# Patient Record
Sex: Male | Born: 1970 | Race: Black or African American | Hispanic: No | Marital: Single | Smoking: Current every day smoker
Health system: Southern US, Community
[De-identification: ages and names within clinical notes are randomized; demographics above are authoritative.]

## PROBLEM LIST (undated history)

## (undated) DIAGNOSIS — I10 Essential (primary) hypertension: Secondary | ICD-10-CM

---

## 2000-10-12 ENCOUNTER — Emergency Department (HOSPITAL_COMMUNITY): Admission: EM | Admit: 2000-10-12 | Discharge: 2000-10-13 | Payer: Self-pay | Admitting: Emergency Medicine

## 2013-05-01 ENCOUNTER — Encounter: Payer: Self-pay | Admitting: Adult Health

## 2013-05-01 ENCOUNTER — Ambulatory Visit (INDEPENDENT_AMBULATORY_CARE_PROVIDER_SITE_OTHER): Payer: BC Managed Care – PPO | Admitting: Adult Health

## 2013-05-01 VITALS — BP 142/96 | HR 80 | Temp 98.3°F | Resp 12 | Ht 73.0 in | Wt 219.5 lb

## 2013-05-01 DIAGNOSIS — Z113 Encounter for screening for infections with a predominantly sexual mode of transmission: Secondary | ICD-10-CM | POA: Insufficient documentation

## 2013-05-01 DIAGNOSIS — Z0000 Encounter for general adult medical examination without abnormal findings: Secondary | ICD-10-CM | POA: Insufficient documentation

## 2013-05-01 LAB — CBC WITH DIFFERENTIAL/PLATELET
Basophils Absolute: 0.1 10*3/uL (ref 0.0–0.1)
Eosinophils Absolute: 0.1 10*3/uL (ref 0.0–0.7)
HCT: 43.7 % (ref 39.0–52.0)
Hemoglobin: 14.5 g/dL (ref 13.0–17.0)
Lymphs Abs: 2.7 10*3/uL (ref 0.7–4.0)
MCHC: 33.1 g/dL (ref 30.0–36.0)
Neutro Abs: 8.1 10*3/uL — ABNORMAL HIGH (ref 1.4–7.7)
Platelets: 155 10*3/uL (ref 150.0–400.0)
RDW: 14.2 % (ref 11.5–14.6)

## 2013-05-01 LAB — TSH: TSH: 0.48 u[IU]/mL (ref 0.35–5.50)

## 2013-05-01 LAB — HEPATIC FUNCTION PANEL
Albumin: 4.1 g/dL (ref 3.5–5.2)
Total Protein: 7.9 g/dL (ref 6.0–8.3)

## 2013-05-01 NOTE — Assessment & Plan Note (Signed)
He is requesting STD testing. He recently met a woman who is requesting that he be tested prior to initiating an intimate relationship. Check for gonorrhea, chlamydia, syphillis, herpes, HIV

## 2013-05-01 NOTE — Progress Notes (Signed)
Subjective:    Patient ID: Dan Hicks, male    DOB: November 30, 1970, 42 y.o.   MRN: [id number]  HPI  Patient is a pleasant 42 y/o male who presents to establish care. He has not seen a PCP in 7 years. He is feeling well overall. He would like a full panel of STD check. He has started dating a woman who has requested that he be screened. Patient reports that he has had chlamydia twice while he was in the [employer]. No current symptoms.  History reviewed. No pertinent past medical history.  History reviewed. No pertinent past surgical history.   Family History  Problem Relation Age of Onset  . Heart disease Father 30    Heart Dz     History   Social History  . Marital Status: Single    Spouse Name: N/A    Number of Children: 3  . Years of Education: 13   Occupational History  . Maintenance at [employer]    Social History Main Topics  . Smoking status: Current Every Day Smoker -- 1.00 packs/day for 16 years  . Smokeless tobacco: Never Used  . Alcohol Use: Yes     Comment: Approximately 14 beers weekly  . Drug Use: No  . Sexual Activity: Yes   Other Topics Concern  . Not on file   Social History Narrative   Patient is currently living in [place]. He has 3 children from 2 different mothers. He has a set of fraternal twins age 44 who live in [place] with their mother and a 43 year old girl who lives with her mother. Mr. Hicks works at the [employer] in [employer].     Review of Systems  Constitutional: Negative.   HENT: Negative.   Eyes: Negative.   Respiratory: Negative.   Cardiovascular: Negative.   Gastrointestinal: Negative.   Endocrine: Negative.   Genitourinary: Negative.   Musculoskeletal: Negative.    BP 142/96  Pulse 80  Temp(Src) 98.3 F (36.8 C) (Oral)  Resp 12  Ht 6\' 1"  (1.854 m)  Wt 219 lb 8 oz (99.565 kg)  BMI 28.97 kg/m2  SpO2 98%     Objective:   Physical Exam  Constitutional: He is oriented to person, place, and  time.  Pleasant male in NAD  HENT:  Head: Normocephalic and atraumatic.  Right Ear: External ear normal.  Left Ear: External ear normal.  Nose: Nose normal.  Mouth/Throat: Oropharynx is clear and moist.  Eyes: Conjunctivae and EOM are normal. Pupils are equal, round, and reactive to light. Scleral icterus is present.  Neck: Normal range of motion. Neck supple. No tracheal deviation present. No thyromegaly present.  Cardiovascular: Normal rate, regular rhythm, normal heart sounds and intact distal pulses.  Exam reveals no gallop and no friction rub.   No murmur heard. Pulmonary/Chest: Effort normal and breath sounds normal. No respiratory distress. He has no wheezes. He has no rales. He exhibits no tenderness.  Abdominal: Soft. Bowel sounds are normal. He exhibits no distension and no mass. There is no tenderness. There is no rebound and no guarding.  Musculoskeletal: Normal range of motion. He exhibits no edema and no tenderness.  Lymphadenopathy:    He has no cervical adenopathy.  Neurological: He is alert and oriented to person, place, and time. He has normal reflexes. He displays normal reflexes. No cranial nerve deficit. He exhibits normal muscle tone. Coordination normal.  Skin: Skin is warm and dry.  Psychiatric: He has a normal mood and  affect. His behavior is normal. Judgment and thought content normal.      Assessment & Plan:

## 2013-05-01 NOTE — Assessment & Plan Note (Signed)
Patient has not seen a health care provider in many years. Blood pressure this visit is slightly elevated.? White coat syndrome. He has mild scleral icterus. Check hepatic panel, cbc w/diff, bmet, tsh, lipids. He will non fasting labs drawn today and return for his fasting labs next week.

## 2013-05-01 NOTE — Patient Instructions (Addendum)
   Thank you for choosing [place] [facility] for your health care needs.  Please have your labs drawn prior to leaving the office.  The results will be available through MyChart for your convenience. Please remember to activate this. The activation code is located at the end of this form.  Please schedule your lab appointment for next week. You will need to be fasting for 8 hours. Nothing to eat or drink for 8 hours prior. You may drink water.

## 2013-05-02 LAB — GC/CHLAMYDIA PROBE AMP, URINE
Chlamydia, Swab/Urine, PCR: NEGATIVE
GC Probe Amp, Urine: NEGATIVE

## 2013-05-02 LAB — RPR

## 2013-05-02 LAB — HIV ANTIBODY (ROUTINE TESTING W REFLEX): HIV: NONREACTIVE

## 2013-05-05 ENCOUNTER — Other Ambulatory Visit (INDEPENDENT_AMBULATORY_CARE_PROVIDER_SITE_OTHER): Payer: BC Managed Care – PPO

## 2013-05-05 DIAGNOSIS — Z0000 Encounter for general adult medical examination without abnormal findings: Secondary | ICD-10-CM

## 2013-05-05 LAB — BASIC METABOLIC PANEL
CO2: 28 mEq/L (ref 19–32)
Calcium: 9.4 mg/dL (ref 8.4–10.5)
Creatinine, Ser: 1 mg/dL (ref 0.4–1.5)
GFR: 109.55 mL/min (ref >60.00)
Glucose, Bld: 92 mg/dL (ref 70–99)

## 2013-05-05 LAB — LIPID PANEL
HDL: 38.4 mg/dL — ABNORMAL LOW (ref >39.00)
Triglycerides: 149 mg/dL (ref 0.0–149.0)
VLDL: 29.8 mg/dL (ref 0.0–40.0)

## 2013-06-25 ENCOUNTER — Other Ambulatory Visit: Payer: Self-pay

## 2022-01-24 ENCOUNTER — Emergency Department
Admission: EM | Admit: 2022-01-24 | Discharge: 2022-01-24 | Disposition: A | Discharge status: Home / Self Care | Payer: BC Managed Care – PPO | Attending: Emergency Medicine | Admitting: Emergency Medicine

## 2022-01-24 ENCOUNTER — Encounter: Payer: Self-pay | Admitting: Emergency Medicine

## 2022-01-24 ENCOUNTER — Emergency Department: Payer: BC Managed Care – PPO

## 2022-01-24 ENCOUNTER — Ambulatory Visit
Admission: EM | Admit: 2022-01-24 | Discharge: 2022-01-24 | Payer: BC Managed Care – PPO | Attending: Emergency Medicine | Admitting: Emergency Medicine

## 2022-01-24 ENCOUNTER — Encounter: Payer: Self-pay | Admitting: Medical Oncology

## 2022-01-24 DIAGNOSIS — M25511 Pain in right shoulder: Secondary | ICD-10-CM | POA: Insufficient documentation

## 2022-01-24 DIAGNOSIS — D691 Qualitative platelet defects: Secondary | ICD-10-CM | POA: Insufficient documentation

## 2022-01-24 DIAGNOSIS — I160 Hypertensive urgency: Secondary | ICD-10-CM

## 2022-01-24 DIAGNOSIS — R9431 Abnormal electrocardiogram [ECG] [EKG]: Secondary | ICD-10-CM

## 2022-01-24 DIAGNOSIS — I10 Essential (primary) hypertension: Secondary | ICD-10-CM | POA: Insufficient documentation

## 2022-01-24 HISTORY — DX: Essential (primary) hypertension: I10

## 2022-01-24 LAB — BASIC METABOLIC PANEL
Anion gap: 6 (ref 5–15)
BUN: 12 mg/dL (ref 6–20)
CO2: 24 mmol/L (ref 22–32)
Calcium: 10.4 mg/dL — ABNORMAL HIGH (ref 8.9–10.3)
Chloride: 106 mmol/L (ref 98–111)
Creatinine, Ser: 0.87 mg/dL (ref 0.61–1.24)
GFR, Estimated: >60 mL/min (ref >60)
Glucose, Bld: 101 mg/dL — ABNORMAL HIGH (ref 70–99)
Potassium: 4.2 mmol/L (ref 3.5–5.1)
Sodium: 136 mmol/L (ref 135–145)

## 2022-01-24 LAB — CBC
HCT: 46.2 % (ref 39.0–52.0)
Hemoglobin: 15.1 g/dL (ref 13.0–17.0)
MCH: 29.9 pg (ref 26.0–34.0)
MCHC: 32.7 g/dL (ref 30.0–36.0)
MCV: 91.5 fL (ref 80.0–100.0)
Platelets: UNDETERMINED 10*3/uL (ref 150–400)
RBC: 5.05 MIL/uL (ref 4.22–5.81)
RDW: 13.1 % (ref 11.5–15.5)
WBC: 9 10*3/uL (ref 4.0–10.5)
nRBC: 0 % (ref 0.0–0.2)

## 2022-01-24 LAB — IMMATURE PLATELET FRACTION: Immature Platelet Fraction: 30 % — ABNORMAL HIGH (ref 1.2–8.6)

## 2022-01-24 LAB — TROPONIN I (HIGH SENSITIVITY): Troponin I (High Sensitivity): 9 ng/L (ref <18)

## 2022-01-24 MED ORDER — KETOROLAC TROMETHAMINE 30 MG/ML IJ SOLN
30.0000 mg | Freq: Once | INTRAMUSCULAR | Status: AC
Start: 2022-01-24 — End: 2022-01-24
  Administered 2022-01-24: 30 mg via INTRAMUSCULAR
  Filled 2022-01-24: qty 1

## 2022-01-24 MED ORDER — IBUPROFEN 600 MG PO TABS: 600.0000 mg | ORAL_TABLET | Freq: Four times a day (QID) | ORAL | 0 refills | Status: AC | PRN

## 2022-01-24 MED ORDER — CYCLOBENZAPRINE HCL 5 MG PO TABS
5.0000 mg | ORAL_TABLET | Freq: Three times a day (TID) | ORAL | 0 refills | Status: AC | PRN
Start: 2022-01-24 — End: 2022-01-27

## 2022-01-24 MED ORDER — LIDOCAINE 5 % EX PTCH: 1.0000 | MEDICATED_PATCH | Freq: Two times a day (BID) | CUTANEOUS | 0 refills | Status: AC

## 2022-01-24 MED ORDER — AMLODIPINE BESYLATE 2.5 MG PO TABS: 5.0000 mg | ORAL_TABLET | Freq: Every day | ORAL | 0 refills | Status: DC

## 2022-01-24 MED ORDER — ACETAMINOPHEN 500 MG PO TABS
1000.0000 mg | ORAL_TABLET | Freq: Once | ORAL | Status: AC
  Administered 2022-01-24: 1000 mg via ORAL
  Filled 2022-01-24: qty 2

## 2022-01-24 MED ORDER — LIDOCAINE 5 % EX PTCH
1.0000 | MEDICATED_PATCH | CUTANEOUS | Status: DC
Start: 2022-01-24 — End: 2022-01-24
  Administered 2022-01-24: 1 via TRANSDERMAL
  Filled 2022-01-24: qty 1

## 2022-01-24 NOTE — ED Triage Notes (Signed)
Pt reports that he was sent over from urgent care for rt shoulder pain that began Sunday. Pt reports he was told his EKG was abnormal. Pt reports no pain to chest, hurts to raise arm up. A/O x 4 and ambulatory NAD noted.

## 2022-01-24 NOTE — ED Notes (Signed)
Patient is being discharged from the Urgent Care and sent to the Emergency Department via person vehicle . Per Wendee Beavers, NP, patient is in need of higher level of care due to Hypertensive emergency and abnormal EKG. Patient is aware and verbalizes understanding of plan of care.  Vitals:   01/24/22 1014 01/24/22 1018  BP: (!) 197/130 (!) 185/122  Pulse: 99   Resp: 18   Temp: 98 F (36.7 C)   SpO2: 97%

## 2022-01-24 NOTE — ED Provider Notes (Signed)
[facility] Provider Note    Event Date/Time   First MD Initiated Contact with Patient 01/24/22 1148     (approximate)   History   Shoulder Pain   HPI  Dan Hicks is a 51 y.o. male who comes in with 3 days of right shoulder pain with limited range of motion secondary to discomfort.  He denies any numbness, weakness, chest pain, shortness of breath.  I reviewed the note from the urgent care today where patient was sent over due to elevated blood pressure has not seen his primary care doctor for many years and they were concerned about his EKG.  Patient reports that he has right shoulder pain that he woke up with 3 days ago.  He reports that he has no pain when he is completely at rest but when he tries to lift his shoulder up over his head he develops pain.  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  He denies any prior surgeries of the shoulder shoulder.  Denies any chest pain, abdominal pain, shortness of breath, leg swelling or other symptoms.  He reports that this is happened previously but never been this severe.  He denies any falls, landing on his shoulder.  Physical Exam   Triage Vital Signs: ED Triage Vitals  Enc Vitals Group     BP 01/24/22 1052 (!) 199/119     Pulse Rate 01/24/22 1052 94     Resp 01/24/22 1052 18     Temp 01/24/22 1049 98 F (36.7 C)     Temp Source 01/24/22 1049 Oral     SpO2 01/24/22 1052 98 %     Weight 01/24/22 1050 218 lb 4.1 oz (99 kg)     Height 01/24/22 1050 6\' 1"  (1.854 m)     Head Circumference --      Peak Flow --      Pain Score 01/24/22 1050 1     Pain Loc --      Pain Edu? --      Excl. in GC? --     Most recent vital signs: Vitals:   01/24/22 1049 01/24/22 1052  BP:  (!) 199/119  Pulse:  94  Resp:  18  Temp: 98 F (36.7 C)   SpO2:  98%     General: Awake, no distress.  CV:  Good peripheral perfusion.  Resp:  Normal  effort.  Abd:  No distention.  Other:  Patient has limited range of motion his right shoulder and can only lift it up about halfway before he developed some discomfort.  He is got tenderness on the anterior part of the shoulder.  He is got good distal pulse and radial, ulnar, median nerve are all intact.  He has no warmth or redness of the joint.  When the arm is hanging without any movement he denies any pain   ED Results / Procedures / Treatments   Labs (all labs ordered are listed, but only abnormal results are displayed) Labs Reviewed  BASIC METABOLIC PANEL - Abnormal; Notable for the following components:      Result Value   Glucose, Bld 101 (*)    Calcium 10.4 (*)    All other components within normal limits  CBC  TROPONIN I (HIGH SENSITIVITY)     EKG  My interpretation of EKG:  Normal sinus rhythm 93 with a little  ST elevation in V3 and V2, no T wave inversions, normal intervals.  Prior EKG he did have a T wave version it looks like in lead III with similar ST elevation in V2 and V3, normal sinus, normal intervals  RADIOLOGY I have reviewed the xray personally and interpreted and do not see any evidence of pneumonia or widened mediastinum   PROCEDURES:  Critical Care performed: No  Procedures   MEDICATIONS ORDERED IN ED: Medications  lidocaine (LIDODERM) 5 % 1 patch (1 patch Transdermal Patch Applied 01/24/22 1212)  ketorolac (TORADOL) 30 MG/ML injection 30 mg (30 mg Intramuscular Given 01/24/22 1212)  acetaminophen (TYLENOL) tablet 1,000 mg (1,000 mg Oral Given 01/24/22 1212)     IMPRESSION / MDM / ASSESSMENT AND PLAN / ED COURSE  I reviewed the triage vital signs and the nursing notes.   Patient's presentation is most consistent with acute presentation with potential threat to life or bodily function.    Differential includes ACS, lower suspicion for dissection given the significant ongoing for 3 days and no chest pain and the pain in the shoulder seems to be  worse with movement.  This seems all musculoskeletal or ligamentous in nature.  Will get x-ray just to make sure no evidence of any fracture, dislocation, mass.   Troponin was negative.  Symptoms been going on for 3 days and it seems more musculoskeletal in nature given he has no pain without movement of the arm therefore do not need repeat troponin.  I discussed this with him and he is adamant that he had no chest pain and the pain is only with movement and also agrees with holding off on troponin BNP slightly elevated calcium.  CBC showed some platelet clumps-there was some platelet clumping noted on prior CBC back in 2014.  Discussed with Dr. Donneta Romberg who wanted patient to have a different median to run the platelets but they do not do that at this hospital and [facility] does not do the send out any longer so we will add on the platelet immature fraction instead.  Immature platelet fraction is abnormal and oncology will follow-up with him outpatient for this.  He has no petechiae on exam and denies any issues with bleeding so do not feel like this is related to his musculoskeletal right shoulder pain.  Discussed with patient need to follow-up with either primary care doctor or cardiology for repeat blood pressure.  Reports been on blood pressure medicine previously but does not remember what it was.  We will start patient on amlodipine and he understands that he needs a recheck for this outpatient.  For his shoulder pain again I suspect is more likely ligamental in nature and I will have him follow-up with orthopedics and start on some ibuprofen, Tylenol, lidocaine patches and Flexeril.  Understands not to drive or work while on this  I discussed the provisional nature of ED diagnosis, the treatment so far, the ongoing plan of care, follow up appointments and return precautions with the patient and any family or support people present. They expressed understanding and agreed with the plan,  discharged home.      FINAL CLINICAL IMPRESSION(S) / ED DIAGNOSES   Final diagnoses:  Acute pain of right shoulder  Hypertension, unspecified type  Platelet disorder (HCC)     Rx / DC Orders   ED Discharge Orders     None        Note:  This document was prepared using Dragon voice recognition software and  may include unintentional dictation errors.   Concha Se, MD 01/24/22 1322

## 2022-01-24 NOTE — ED Provider Notes (Signed)
[facility]    CSN: [id number] Arrival date & time: 01/24/22  1001      History   Chief Complaint Chief Complaint  Patient presents with   Shoulder Pain    HPI Dan Hicks is a 51 y.o. male.  Patient presents with 3-day history of right shoulder pain with limited ROM due to discomfort.  No falls or injury.  He denies numbness, weakness, chest pain, shortness of breath, dizziness, headache, facial droop, confusion, fevers, chills, or other symptoms.  No treatments at home.  He reports history of hypertension which was treated with antihypertensive medication but has not seen a doctor or taken medication in many years.  He denies history of MI or stroke.  The history is provided by the patient.   History reviewed. No pertinent past medical history.  Patient Active Problem List   Diagnosis Date Noted   Routine general medical examination at a health care facility 05/01/2013   Screening for STD (sexually transmitted disease) 05/01/2013    History reviewed. No pertinent surgical history.     Home Medications    Prior to Admission medications   Not on File    Family History Family History  Problem Relation Age of Onset   Heart disease Father 65       Heart Dz    Social History Social History   Tobacco Use   Smoking status: Every Day    Packs/day: 1.00    Years: 16.00    Pack years: 16.00    Types: Cigarettes   Smokeless tobacco: Never  Vaping Use   Vaping Use: Never used  Substance Use Topics   Alcohol use: Yes    Comment: Approximately 14 beers weekly   Drug use: No     Allergies   Patient has no known allergies.   Review of Systems Review of Systems  Constitutional:  Negative for chills and fever.  Respiratory:  Negative for cough and shortness of breath.   Cardiovascular:  Negative for chest pain and palpitations.  Gastrointestinal:  Negative for nausea and vomiting.  Musculoskeletal:  Positive for arthralgias. Negative for gait  problem and joint swelling.  Skin:  Negative for color change and rash.  Neurological:  Negative for dizziness, seizures, syncope, facial asymmetry, speech difficulty, weakness, light-headedness, numbness and headaches.  All other systems reviewed and are negative.   Physical Exam Triage Vital Signs ED Triage Vitals  Enc Vitals Group     BP      Pulse      Resp      Temp      Temp src      SpO2      Weight      Height      Head Circumference      Peak Flow      Pain Score      Pain Loc      Pain Edu?      Excl. in GC?    No data found.  Updated Vital Signs BP (!) 185/122 (BP Location: Left Arm)   Pulse 99   Temp 98 F (36.7 C)   Resp 18   SpO2 97%   Visual Acuity Right Eye Distance:   Left Eye Distance:   Bilateral Distance:    Right Eye Near:   Left Eye Near:    Bilateral Near:     Physical Exam Vitals and nursing note reviewed.  Constitutional:      General: He is not  in acute distress.    Appearance: Normal appearance. He is well-developed. He is not ill-appearing.  HENT:     Mouth/Throat:     Mouth: Mucous membranes are moist.  Cardiovascular:     Rate and Rhythm: Normal rate and regular rhythm.     Heart sounds: Normal heart sounds.  Pulmonary:     Effort: Pulmonary effort is normal. No respiratory distress.     Breath sounds: Normal breath sounds.  Abdominal:     Palpations: Abdomen is soft.     Tenderness: There is no abdominal tenderness.  Musculoskeletal:     Cervical back: Neck supple.     Right lower leg: No edema.     Left lower leg: No edema.     Comments: Limited ROM of right shoulder.   Skin:    General: Skin is warm and dry.     Capillary Refill: Capillary refill takes less than 2 seconds.  Neurological:     General: No focal deficit present.     Mental Status: He is alert and oriented to person, place, and time.     Cranial Nerves: No cranial nerve deficit.     Sensory: No sensory deficit.     Motor: No weakness.     Gait:  Gait normal.  Psychiatric:        Mood and Affect: Mood normal.        Behavior: Behavior normal.     UC Treatments / Results  Labs (all labs ordered are listed, but only abnormal results are displayed) Labs Reviewed - No data to display  EKG   Radiology No results found.  Procedures Procedures (including critical care time)  Medications Ordered in UC Medications - No data to display  Initial Impression / Assessment and Plan / UC Course  I have reviewed the triage vital signs and the nursing notes.  Pertinent labs & imaging results that were available during my care of the patient were reviewed by me and considered in my medical decision making (see chart for details).   Hypertensive Urgency, Abnormal EKG, right shoulder pain. Blood pressure 197/130; repeated at 185/122.  EKG shows sinus rhythm, rate 90, inverted T wave in lead III, 42mm ST elevation in V2 and V3, no previous to compare. The patient reports history of HTN and previously on antihypertensive but has not seen a PCP in many years.  Sending him to the ED for evaluation of his very high blood pressure and abnormal EKG.  He declines EMS and reports he feels stable to drive himself to the ED.   Final Clinical Impressions(s) / UC Diagnoses   Final diagnoses:  Hypertensive urgency  Abnormal EKG  Acute pain of right shoulder     Discharge Instructions      Blood pressure 197/130; repeat 185/122       ED Prescriptions   None    PDMP not reviewed this encounter.   Mickie Bail, NP 01/24/22 1036

## 2022-01-24 NOTE — ED Triage Notes (Signed)
Pt presents with right side shoulder pain x 3 days. Pt states he has a knot on his shoulder and its difficult for him to raise his arm.

## 2022-01-24 NOTE — ED Notes (Signed)
dc ppw provided. followup and rx information reviewed as applicable. Pt questions addressed at this time. Pt declines VS at time of DC and provides verbal consent for DC. Pt ambulatory to lobby alert and oriented. 

## 2022-01-24 NOTE — Discharge Instructions (Addendum)
Blood pressure 197/130; repeat 185/122

## 2022-01-24 NOTE — Discharge Instructions (Addendum)
We are starting you on some blood pressure medication.  We can start with 1 tablet for the first week and if you are not feeling lightheaded or dizzy and check Blood pressure as [facility] and still elevated over 140/80 can go up to the 2 tablets daily  We have placed a referral for cardiology to help with your blood pressure if you cannot get a primary care doctor appointment.  For your shoulder you can call orthopedics to get a follow-up appointment and take Tylenol 1 g every 8 hours with the ibuprofen and Flexeril.  Do not drive or work on the [employer].  For your abnormal platelets you are going to follow-up with hematology

## 2022-02-05 ENCOUNTER — Encounter: Payer: Self-pay | Admitting: Internal Medicine

## 2022-02-05 ENCOUNTER — Inpatient Hospital Stay: Payer: BC Managed Care – PPO | Attending: Internal Medicine | Admitting: Internal Medicine

## 2022-02-05 ENCOUNTER — Other Ambulatory Visit: Payer: Self-pay

## 2022-02-05 ENCOUNTER — Inpatient Hospital Stay: Payer: BC Managed Care – PPO

## 2022-02-05 DIAGNOSIS — I10 Essential (primary) hypertension: Secondary | ICD-10-CM | POA: Diagnosis not present

## 2022-02-05 DIAGNOSIS — Z79899 Other long term (current) drug therapy: Secondary | ICD-10-CM | POA: Insufficient documentation

## 2022-02-05 DIAGNOSIS — D696 Thrombocytopenia, unspecified: Secondary | ICD-10-CM | POA: Diagnosis present

## 2022-02-05 DIAGNOSIS — F17210 Nicotine dependence, cigarettes, uncomplicated: Secondary | ICD-10-CM | POA: Insufficient documentation

## 2022-02-05 LAB — COMPREHENSIVE METABOLIC PANEL
ALT: 34 U/L (ref 0–44)
AST: 26 U/L (ref 15–41)
Albumin: 4.1 g/dL (ref 3.5–5.0)
Alkaline Phosphatase: 80 U/L (ref 38–126)
Anion gap: 9 (ref 5–15)
BUN: 12 mg/dL (ref 6–20)
CO2: 24 mmol/L (ref 22–32)
Calcium: 10.1 mg/dL (ref 8.9–10.3)
Chloride: 106 mmol/L (ref 98–111)
Creatinine, Ser: 1 mg/dL (ref 0.61–1.24)
GFR, Estimated: >60 mL/min (ref >60)
Glucose, Bld: 95 mg/dL (ref 70–99)
Potassium: 4.3 mmol/L (ref 3.5–5.1)
Sodium: 139 mmol/L (ref 135–145)
Total Bilirubin: 0.6 mg/dL (ref 0.3–1.2)
Total Protein: 8.3 g/dL — ABNORMAL HIGH (ref 6.5–8.1)

## 2022-02-05 LAB — CBC WITH DIFFERENTIAL/PLATELET
Abs Immature Granulocytes: 0.03 10*3/uL (ref 0.00–0.07)
Basophils Absolute: 0 10*3/uL (ref 0.0–0.1)
Basophils Relative: 0 %
Eosinophils Absolute: 0.1 10*3/uL (ref 0.0–0.5)
Eosinophils Relative: 1 %
HCT: 42.4 % (ref 39.0–52.0)
Hemoglobin: 14.3 g/dL (ref 13.0–17.0)
Immature Granulocytes: 0 %
Lymphocytes Relative: 21 %
Lymphs Abs: 1.6 10*3/uL (ref 0.7–4.0)
MCH: 30.5 pg (ref 26.0–34.0)
MCHC: 33.7 g/dL (ref 30.0–36.0)
MCV: 90.4 fL (ref 80.0–100.0)
Monocytes Absolute: 0.5 10*3/uL (ref 0.1–1.0)
Monocytes Relative: 7 %
Neutro Abs: 5.4 10*3/uL (ref 1.7–7.7)
Neutrophils Relative %: 71 %
Platelets: 196 10*3/uL (ref 150–400)
RBC: 4.69 MIL/uL (ref 4.22–5.81)
RDW: 12.9 % (ref 11.5–15.5)
WBC: 7.6 10*3/uL (ref 4.0–10.5)
nRBC: 0 % (ref 0.0–0.2)

## 2022-02-05 LAB — TECHNOLOGIST SMEAR REVIEW: Plt Morphology: NORMAL

## 2022-02-05 LAB — IMMATURE PLATELET FRACTION: Immature Platelet Fraction: 2.3 % (ref 1.2–8.6)

## 2022-02-05 NOTE — Progress Notes (Signed)
[facility] CONSULT NOTE  Patient Care Team: Pcp, No as PCP - General Earna Coder, MD as Consulting Physician (Oncology)  CHIEF COMPLAINTS/PURPOSE OF CONSULTATION: Platelet clumping  #Thrombocytopenia /platelet clumping  Oncology History   No history exists.     HISTORY OF PRESENTING ILLNESS: Alone.  Ambulating independently. Dan Hicks 51 y.o.  male patient was recently evaluated in the emergency room for right shoulder pain/limited range of motion.  Incidentally noted to have thrombocytopenia/platelet clumping.  Patient has been referred to [place] for further evaluation recommendations.  Patient denies any nosebleeds.  Denies any easy bruising.  Patient states he ran out of his blood pressure medications about 2 years ago.  He started back on his amlodipine/emergency room about 2 weeks ago.  He does not check his blood pressure at home.  He smokes.  Review of Systems  Constitutional:  Negative for chills, diaphoresis, fever, malaise/fatigue and weight loss.  HENT:  Negative for nosebleeds and sore throat.   Eyes:  Negative for double vision.  Respiratory:  Negative for cough, hemoptysis, sputum production, shortness of breath and wheezing.   Cardiovascular:  Negative for chest pain, palpitations, orthopnea and leg swelling.  Gastrointestinal:  Negative for abdominal pain, blood in stool, constipation, diarrhea, heartburn, melena, nausea and vomiting.  Genitourinary:  Negative for dysuria, frequency and urgency.  Musculoskeletal:  Positive for joint pain. Negative for back pain.  Skin: Negative.  Negative for itching and rash.  Neurological:  Negative for dizziness, tingling, focal weakness, weakness and headaches.  Endo/Heme/Allergies:  Does not bruise/bleed easily.  Psychiatric/Behavioral:  Negative for depression. The patient is not nervous/anxious and does not have insomnia.     MEDICAL HISTORY:  Past Medical History:  Diagnosis Date    Hypertension     SURGICAL HISTORY: History reviewed. No pertinent surgical history.  SOCIAL HISTORY: Social History   Socioeconomic History   Marital status: Single    Spouse name: Not on file   Number of children: 3   Years of education: 13   Highest education level: Not on file  Occupational History   Occupation: Maintenance at [employer]  Tobacco Use   Smoking status: Every Day    Packs/day: 1.00    Years: 16.00    Total pack years: 16.00    Types: Cigarettes   Smokeless tobacco: Never  Vaping Use   Vaping Use: Never used  Substance and Sexual Activity   Alcohol use: Yes    Comment: Approximately 14 beers weekly   Drug use: No   Sexual activity: Yes  Other Topics Concern   Not on file  Social History Narrative   Patient is currently living in [place]. He has 3 children from 2 different mothers. He has a set of fraternal twins age 14 who live in [place] with their mother and a 59 year old girl who lives with her mother. Mr. Hicks works at the [employer] in [employer].       Smokes 1.5-2 ppd x 25 years; alcohol beer 6-7 beer/ day. Lives with room mate.    Social Determinants of Health   Financial Resource Strain: Not on file  Food Insecurity: Not on file  Transportation Needs: Not on file  Physical Activity: Not on file  Stress: Not on file  Social Connections: Not on file  Intimate Partner Violence: Not on file    FAMILY HISTORY: Family History  Problem Relation Age of Onset   Heart disease Father 10  Heart Dz    ALLERGIES:  has No Known Allergies.  MEDICATIONS:  Current Outpatient Medications  Medication Sig Dispense Refill   amLODipine (NORVASC) 2.5 MG tablet Take 2 tablets (5 mg total) by mouth daily. 60 tablet 0   ibuprofen (ADVIL) 600 MG tablet Take 600 mg by mouth every 6 (six) hours as needed.     No current facility-administered medications for this visit.    PHYSICAL EXAMINATION:   Vitals:   02/05/22 1455  02/05/22 1456  BP: (!) 173/111 (!) 171/112  Pulse: 97 92  Temp:    SpO2:     Filed Weights   02/05/22 1421  Weight: 230 lb (104.3 kg)    Physical Exam Vitals and nursing note reviewed.  HENT:     Head: Normocephalic and atraumatic.     Mouth/Throat:     Pharynx: Oropharynx is clear.  Eyes:     Extraocular Movements: Extraocular movements intact.     Pupils: Pupils are equal, round, and reactive to light.  Cardiovascular:     Rate and Rhythm: Normal rate and regular rhythm.  Pulmonary:     Comments: Decreased breath sounds bilaterally.  Abdominal:     Palpations: Abdomen is soft.  Musculoskeletal:        General: Normal range of motion.     Cervical back: Normal range of motion.  Skin:    General: Skin is warm.  Neurological:     General: No focal deficit present.     Mental Status: He is alert and oriented to person, place, and time.  Psychiatric:        Behavior: Behavior normal.        Judgment: Judgment normal.     LABORATORY DATA:  I have reviewed the data as listed Lab Results  Component Value Date   WBC 7.6 02/05/2022   HGB 14.3 02/05/2022   HCT 42.4 02/05/2022   MCV 90.4 02/05/2022   PLT 196 02/05/2022   Recent Labs    01/24/22 1054 02/05/22 1514  NA 136 139  K 4.2 4.3  CL 106 106  CO2 24 24  GLUCOSE 101* 95  BUN 12 12  CREATININE 0.87 1.00  CALCIUM 10.4* 10.1  GFRNONAA >60 >60  PROT  --  8.3*  ALBUMIN  --  4.1  AST  --  26  ALT  --  34  ALKPHOS  --  80  BILITOT  --  0.6    RADIOGRAPHIC STUDIES: I have personally reviewed the radiological images as listed and agreed with the findings in the report. DG Shoulder Right  Result Date: 01/24/2022 CLINICAL DATA:  Right shoulder pain. EXAM: RIGHT SHOULDER - 2+ VIEW COMPARISON:  None Available. FINDINGS: There is no evidence of fracture or dislocation. There is no significant arthropathy or other focal bone abnormality. Soft tissues are unremarkable. IMPRESSION: Negative. Electronically Signed    By: Larose Hires D.O.   On: 01/24/2022 12:54   DG Chest 2 View  Result Date: 01/24/2022 CLINICAL DATA:  Shoulder pain EXAM: CHEST - 2 VIEW COMPARISON:  None Available. FINDINGS: Normal mediastinum and cardiac silhouette. Normal pulmonary vasculature. No evidence of effusion, infiltrate, or pneumothorax. No acute bony abnormality. IMPRESSION: No acute cardiopulmonary process. Electronically Signed   By: Genevive Bi M.D.   On: 01/24/2022 11:16     Thrombocytopenia (HCC) #Platelet clumping noted-June 2023; and also previously [2015].  Discussed with the lab will order CBC; tech review CMP and also platelet immature fraction.  # HTN [  currently on Amodlipine]-poorly controlled diastolic 127.  Patient restarted back on his blood pressure medication about 2 months ago.  Recheck blood pressure-diastolic 111.  Recommend compliance with his amlodipine; and also checking blood pressure at home; and reaching out to PCP/urgent care clinic.  #Active smoker: Counseled the patient to quit smoking.  Defer to PCP.  Thank you Dr.Funke for allowing me to participate in the care of your pleasant patient. Please do not hesitate to contact me with questions or concerns in the interim.  Re-check the BP RN will call re: labsresults # DISPOSITION:  # labs today-  # follow up TBD-Dr.B  Addendum: Patient's platelets were 196 today.  No clumping noted.  Patient will be informed of the labs.  No further follow-up is recommended at the cancer center.     Above plan of care was discussed with patient/family in detail.  My contact information was given to the patient/family.       Earna Coder, MD 02/05/2022 6:58 PM

## 2022-02-05 NOTE — Assessment & Plan Note (Addendum)
#  Platelet clumping noted-June 2023; and also previously [2015].  Discussed with the lab will order CBC; tech review CMP and also platelet immature fraction.  # HTN [currently on Amodlipine]-poorly controlled diastolic 127.  Patient restarted back on his blood pressure medication about 2 months ago.  Recheck blood pressure-diastolic 111.  Recommend compliance with his amlodipine; and also checking blood pressure at home; and reaching out to PCP/urgent care clinic.  #Active smoker: Counseled the patient to quit smoking.  Defer to PCP.  Thank you Dr.Funke for allowing me to participate in the care of your pleasant patient. Please do not hesitate to contact me with questions or concerns in the interim.  Re-check the BP RN will call re: labsresults # DISPOSITION:  # labs today-  # follow up TBD-Dr.B  Addendum: Patient's platelets were 196 today.  No clumping noted.  Patient will be informed of the labs.  No further follow-up is recommended at the cancer center.

## 2022-03-19 ENCOUNTER — Ambulatory Visit (INDEPENDENT_AMBULATORY_CARE_PROVIDER_SITE_OTHER): Payer: BC Managed Care – PPO | Admitting: Cardiovascular Disease

## 2022-03-19 ENCOUNTER — Encounter: Payer: Self-pay | Admitting: Cardiovascular Disease

## 2022-03-19 VITALS — BP 176/110 | HR 101 | Ht 73.5 in | Wt 226.5 lb

## 2022-03-19 DIAGNOSIS — F17200 Nicotine dependence, unspecified, uncomplicated: Secondary | ICD-10-CM | POA: Diagnosis not present

## 2022-03-19 DIAGNOSIS — I517 Cardiomegaly: Secondary | ICD-10-CM | POA: Diagnosis not present

## 2022-03-19 DIAGNOSIS — I10 Essential (primary) hypertension: Secondary | ICD-10-CM

## 2022-03-19 MED ORDER — AMLODIPINE BESYLATE 10 MG PO TABS: 10.0000 mg | ORAL_TABLET | Freq: Every day | ORAL | 3 refills | Status: DC

## 2022-03-19 MED ORDER — LOSARTAN POTASSIUM 50 MG PO TABS: 50.0000 mg | ORAL_TABLET | Freq: Every day | ORAL | 3 refills | Status: DC

## 2022-03-19 NOTE — Progress Notes (Signed)
Cardiology Office Note  Date:  03/19/2022   ID:  Dan Hicks, DOB 1970-10-03, MRN [id number]  PCP:  Pcp, No   Chief Complaint  Patient presents with   New Patient (Initial Visit)    Ref by Artis Delay, MD for hypertension management. Medications reviewed by the patient verbally. Patient c/o shortness of breath with over exertion.     HPI:   Dan Hicks is a 51 year old gentleman with past medical history of Hypertension Presenting by referral from the emergency room for hypertension management  Seen in the emergency room January 24, 2022 hypertensive urgency, shoulder pain  Initially seen in urgent care for shoulder pain and was referred to the emergency room for Hypertensive Urgency, Abnormal EKG, right shoulder pain.  Blood pressure 197/130; repeated at 185/122.   EKG shows sinus rhythm, rate 90, inverted T wave in lead III, 55mm ST elevation in V2 and V3, no previous to compare. The patient reports history of HTN and previously on antihypertensive but has not seen a PCP in many years.   Treated for musculoskeletal chest pain in the ER, In the emergency room started on amlodipine 5 mg daily  On today's visit reports he is not taking amlodipine as prescription ran out, only had 30 days Seen recently by oncology, blood pressure elevated at that time  Father died: heart issues Mother : with HTN  EKG personally reviewed by myself on todays visit Sinus tachycardia rate 101 bpm LVH repolarization abnormality    PMH:   has a past medical history of Hypertension.  PSH:   No past surgical history on file.  Current Outpatient Medications  Medication Sig Dispense Refill   amLODipine (NORVASC) 10 MG tablet Take 1 tablet (10 mg total) by mouth daily. 90 tablet 3   losartan (COZAAR) 50 MG tablet Take 1 tablet (50 mg total) by mouth daily. 90 tablet 3   ibuprofen (ADVIL) 600 MG tablet Take 600 mg by mouth every 6 (six) hours as needed. (Patient not taking: Reported on 03/19/2022)      No current facility-administered medications for this visit.     Allergies:   Patient has no known allergies.   Social History:  The patient  reports that he has been smoking cigarettes. He has a 16.00 pack-year smoking history. He has never used smokeless tobacco. He reports current alcohol use. He reports that he does not use drugs.   Family History:   family history includes Heart disease (age of onset: 35) in his father; Hypertension in his mother.    Review of Systems: Review of Systems  Constitutional: Negative.   HENT: Negative.    Respiratory: Negative.    Cardiovascular: Negative.   Gastrointestinal: Negative.   Musculoskeletal: Negative.   Neurological: Negative.   Psychiatric/Behavioral: Negative.    All other systems reviewed and are negative.   PHYSICAL EXAM: VS:  BP (!) 176/110 (BP Location: Right Arm, Patient Position: Sitting, Cuff Size: Normal)   Pulse (!) 101   Ht 6' 1.5" (1.867 m)   Wt 226 lb 8 oz (102.7 kg)   SpO2 98%   BMI 29.48 kg/m  , BMI Body mass index is 29.48 kg/m. GEN: Well nourished, well developed, in no acute distress HEENT: normal Neck: no JVD, carotid bruits, or masses Cardiac: RRR; no murmurs, rubs, or gallops,no edema  Respiratory:  clear to auscultation bilaterally, normal work of breathing GI: soft, nontender, nondistended, + BS MS: no deformity or atrophy Skin: warm and dry, no rash Neuro:  Strength and sensation are intact Psych: euthymic mood, full affect   Recent Labs: 02/05/2022: ALT 34; BUN 12; Creatinine, Ser 1.00; Hemoglobin 14.3; Platelets 196; Potassium 4.3; Sodium 139    Lipid Panel Lab Results  Component Value Date   CHOL 181 05/05/2013   HDL 38.40 (L) 05/05/2013   LDLCALC 113 (H) 05/05/2013   TRIG 149.0 05/05/2013      Wt Readings from Last 3 Encounters:  03/19/22 226 lb 8 oz (102.7 kg)  02/05/22 230 lb (104.3 kg)  01/24/22 218 lb 4.1 oz (99 kg)      ASSESSMENT AND PLAN:  Problem List Items  Addressed This Visit   None Visit Diagnoses     Hypertension, unspecified type    -  Primary   Relevant Medications   amLODipine (NORVASC) 10 MG tablet   losartan (COZAAR) 50 MG tablet   Other Relevant Orders   EKG 12-Lead   LVH (left ventricular hypertrophy)       Relevant Medications   amLODipine (NORVASC) 10 MG tablet   losartan (COZAAR) 50 MG tablet      Hypertensive urgency Ran out of his medications initially provided by the ER as he only had 30 days Recommend he start and increase amlodipine up to 10 mg daily also start losartan 50 mg daily Suggested he buy blood pressure cuff and call us with blood pressure numbers in the next several weeks.  He can send this through the MyChart  LVH Concern for LVH on EKG with elevated voltage, repolarization abnormality Reports he is asymptomatic, no leg swelling or concern for heart failure No further testing at this time  Smoker We have encouraged him to continue to work on weaning his cigarettes and smoking cessation. He will continue to work on this and does not want any assistance with chantix.     Total encounter time more than 50 minutes  Greater than 50% was spent in counseling and coordination of care with the patient    Signed, Dossie Arbour, M.D., Ph.D. [facility] [place], [place] [phone]

## 2022-03-19 NOTE — Patient Instructions (Addendum)
Call with blood pressure numbers  Medication Instructions:  Please start amlodipine 10 mg daily Please start losartan 50 mg daily  If you need a refill on your cardiac medications before your next appointment, please call your pharmacy.   Lab work: No new labs needed  Testing/Procedures: No new testing needed  Follow-Up: At [facility], you and your health needs are our priority.  As part of our continuing mission to provide you with exceptional heart care, we have created designated Provider Care Teams.  These Care Teams include your primary Cardiologist (physician) and Advanced Practice Providers (APPs -  Physician Assistants and Nurse Practitioners) who all work together to provide you with the care you need, when you need it.  You will need a follow up appointment in 6 months  Providers on your designated Care Team:   Nicolasa Ducking, NP Eula Listen, PA-C Cadence Fransico Pranav, [place]  COVID-19 Vaccine Information can be found at: [email or web address] For questions related to vaccine distribution or appointments, please email [email or web address] or call [phone].

## 2023-03-18 ENCOUNTER — Other Ambulatory Visit: Payer: Self-pay | Admitting: Cardiovascular Disease

## 2023-04-10 ENCOUNTER — Telehealth: Payer: Self-pay | Admitting: Cardiovascular Disease

## 2023-04-10 ENCOUNTER — Other Ambulatory Visit: Payer: Self-pay | Admitting: Cardiovascular Disease

## 2023-04-10 NOTE — Telephone Encounter (Signed)
Patient needs to contact our office for a follow up appointment before any further refills given.

## 2023-04-10 NOTE — Telephone Encounter (Signed)
Left message on voice mail to schedule

## 2023-04-10 NOTE — Telephone Encounter (Signed)
-----   Message from [facility] Jasmine December C sent at 04/10/2023 11:52 AM EDT ----- Please contact patient for a follow up appointment with Dr. Mariah Milling. Thanks, Jasmine December

## 2023-04-11 ENCOUNTER — Telehealth: Payer: Self-pay | Admitting: Cardiovascular Disease

## 2023-04-11 NOTE — Telephone Encounter (Signed)
Left voice mail to schedule appt

## 2023-04-11 NOTE — Telephone Encounter (Signed)
 Left voicemail to schedule appt from recall.

## 2023-04-15 ENCOUNTER — Telehealth: Payer: Self-pay | Admitting: Cardiovascular Disease

## 2023-04-15 NOTE — Telephone Encounter (Signed)
Left voice mail to schedule appt

## 2023-04-17 ENCOUNTER — Encounter: Payer: Self-pay | Admitting: Cardiovascular Disease

## 2023-04-17 NOTE — Telephone Encounter (Signed)
Unable to reach letter mailed to patient

## 2023-05-07 ENCOUNTER — Other Ambulatory Visit: Payer: Self-pay | Admitting: Cardiovascular Disease

## 2023-05-11 ENCOUNTER — Other Ambulatory Visit: Payer: Self-pay | Admitting: Cardiovascular Disease

## 2023-05-13 ENCOUNTER — Telehealth: Payer: Self-pay | Admitting: Cardiovascular Disease

## 2023-05-13 NOTE — Telephone Encounter (Signed)
Left voice mail

## 2023-05-13 NOTE — Telephone Encounter (Signed)
Left voicemail, pt needs to schedule overdue follow up appt.

## 2023-05-13 NOTE — Telephone Encounter (Signed)
Please contact pt for future appointment. Pt overdue for 12 month f/u.

## 2023-05-14 NOTE — Telephone Encounter (Signed)
Left voice mail to schedule appt

## 2023-05-14 NOTE — Telephone Encounter (Signed)
Left voice mail

## 2023-05-15 ENCOUNTER — Telehealth: Payer: Self-pay | Admitting: Cardiovascular Disease

## 2023-05-15 ENCOUNTER — Encounter: Payer: Self-pay | Admitting: Cardiovascular Disease

## 2023-05-15 NOTE — Telephone Encounter (Signed)
Unable to reach letter sent via mail.

## 2023-05-15 NOTE — Telephone Encounter (Signed)
Left voice mail

## 2023-05-26 ENCOUNTER — Other Ambulatory Visit: Payer: Self-pay | Admitting: Cardiovascular Disease

## 2023-05-27 ENCOUNTER — Encounter: Payer: Self-pay | Admitting: Cardiovascular Disease

## 2023-05-27 NOTE — Telephone Encounter (Signed)
Appt required for future refills.  Please schedule.  Thanks

## 2023-05-27 NOTE — Telephone Encounter (Signed)
Left voicemail. Unable to reach letter sent via mail

## 2023-08-30 IMAGING — DX DG SHOULDER 2+V*R*
3 series · 3 of 3 positions shown · non-contrast
Comparison: None Available.

CLINICAL DATA: Right shoulder pain.

EXAM:
RIGHT SHOULDER - 2+ VIEW

[shoulder axial]
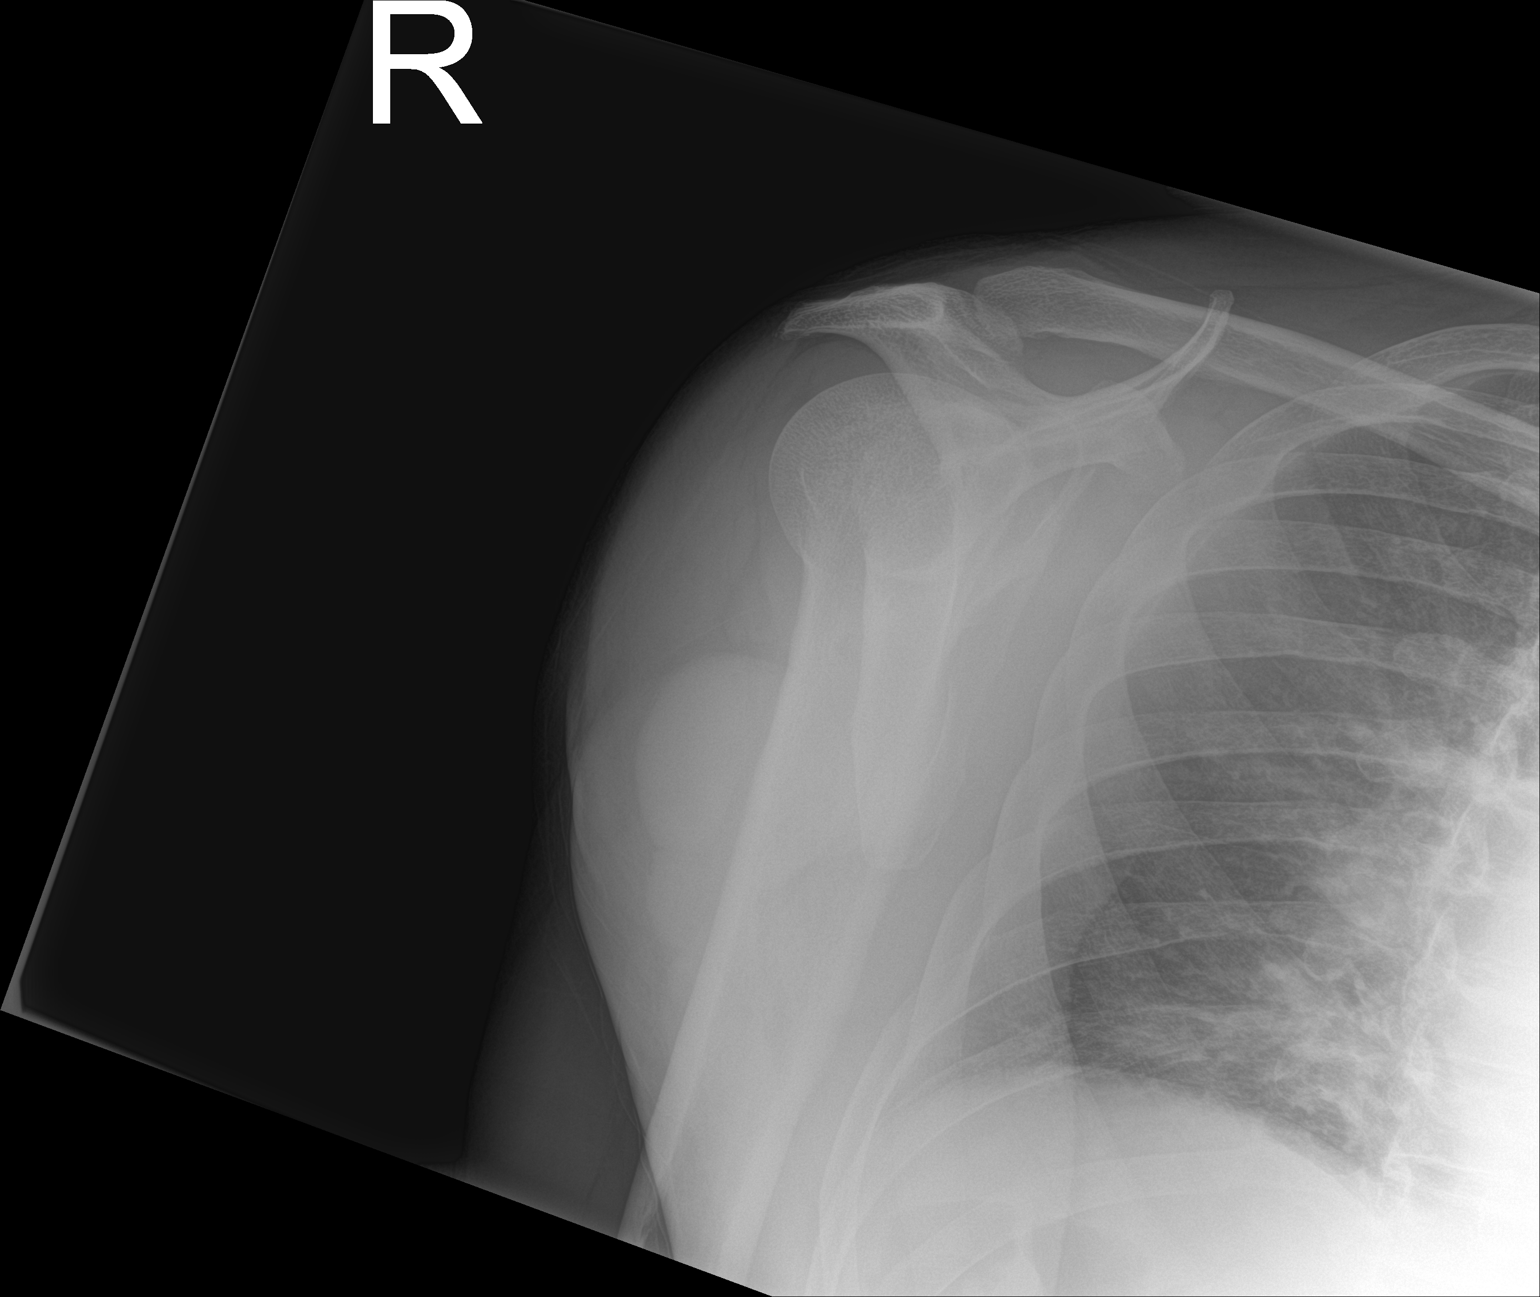

[shoulder ap]
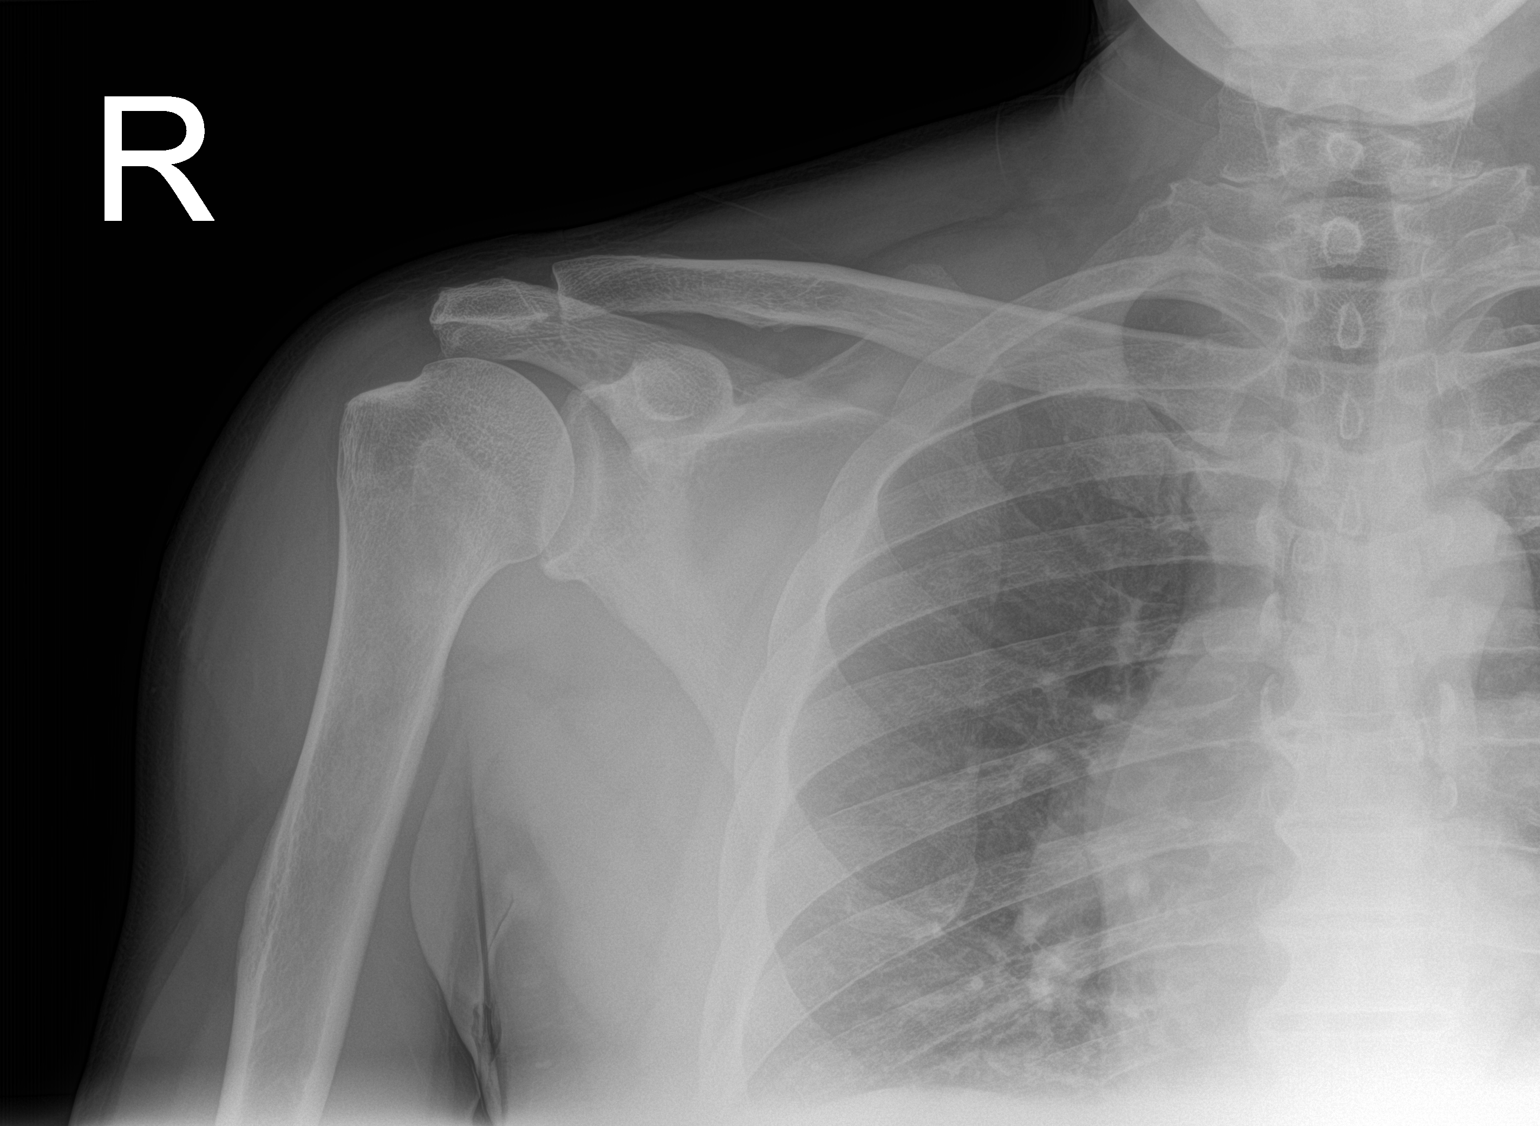

[shoulder obl]
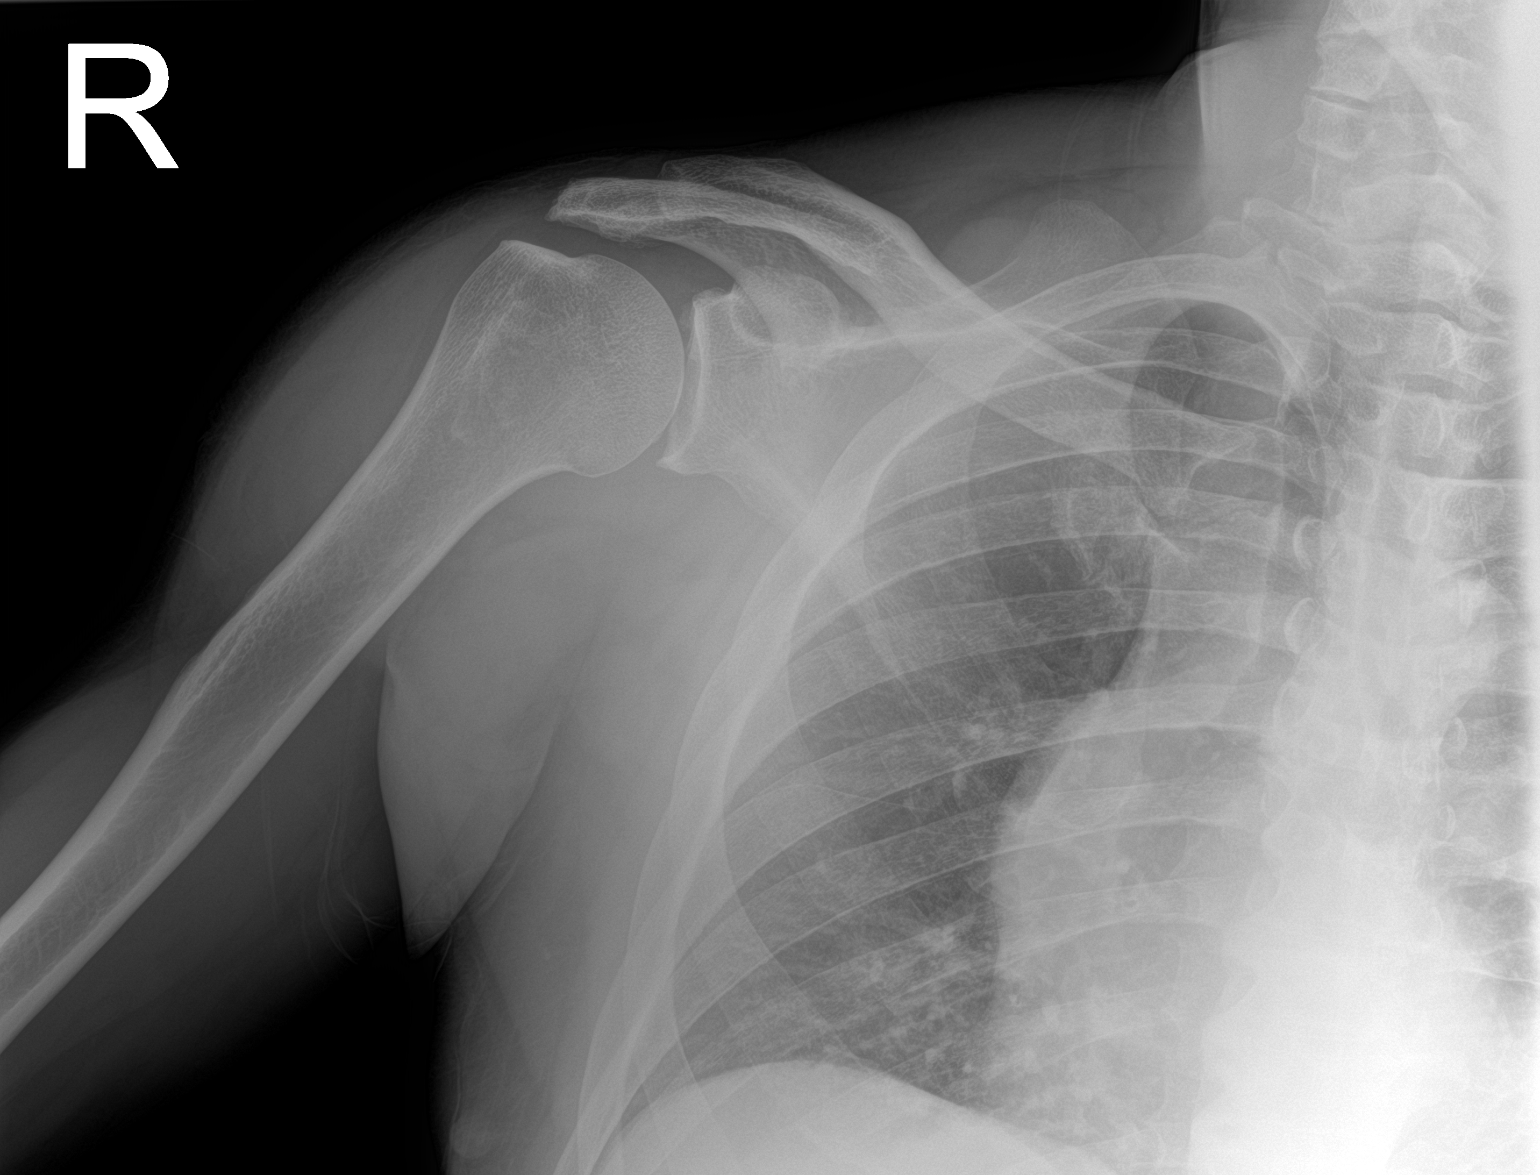

[3 of 3 positions shown; findings below may reference images not displayed]

FINDINGS: There is no evidence of fracture or dislocation. There is no
significant arthropathy or other focal bone abnormality. Soft
tissues are unremarkable.
IMPRESSION: Negative.
